# Patient Record
Sex: Male | Born: 1985 | Race: White | Hispanic: No | Marital: Married | Smoking: Current every day smoker
Health system: Southern US, Community
[De-identification: ages and names within clinical notes are randomized; demographics above are authoritative.]

## PROBLEM LIST (undated history)

## (undated) HISTORY — PX: TYMPANOSTOMY TUBE PLACEMENT: SHX32

## (undated) HISTORY — PX: TONSILLECTOMY: SUR1361

---

## 2016-10-06 ENCOUNTER — Emergency Department (HOSPITAL_COMMUNITY): Payer: Self-pay

## 2016-10-06 ENCOUNTER — Encounter (HOSPITAL_COMMUNITY): Payer: Self-pay | Admitting: *Deleted

## 2016-10-06 ENCOUNTER — Emergency Department (HOSPITAL_COMMUNITY)
Admission: EM | Admit: 2016-10-06 | Discharge: 2016-10-06 | Disposition: A | Discharge status: Home / Self Care | Payer: Self-pay | Attending: Emergency Medicine | Admitting: Emergency Medicine

## 2016-10-06 DIAGNOSIS — F17210 Nicotine dependence, cigarettes, uncomplicated: Secondary | ICD-10-CM | POA: Insufficient documentation

## 2016-10-06 DIAGNOSIS — X58XXXA Exposure to other specified factors, initial encounter: Secondary | ICD-10-CM | POA: Insufficient documentation

## 2016-10-06 DIAGNOSIS — Y939 Activity, unspecified: Secondary | ICD-10-CM | POA: Insufficient documentation

## 2016-10-06 DIAGNOSIS — Y929 Unspecified place or not applicable: Secondary | ICD-10-CM | POA: Insufficient documentation

## 2016-10-06 DIAGNOSIS — Y999 Unspecified external cause status: Secondary | ICD-10-CM | POA: Insufficient documentation

## 2016-10-06 DIAGNOSIS — M79645 Pain in left finger(s): Secondary | ICD-10-CM

## 2016-10-06 DIAGNOSIS — S63697A Other sprain of left little finger, initial encounter: Secondary | ICD-10-CM | POA: Insufficient documentation

## 2016-10-06 MED ORDER — TRAMADOL HCL 50 MG PO TABS: 50.0000 mg | ORAL_TABLET | Freq: Four times a day (QID) | ORAL | 0 refills | Status: AC | PRN

## 2016-10-06 MED ORDER — ACETAMINOPHEN 325 MG PO TABS: 650.0000 mg | ORAL_TABLET | Freq: Once | ORAL | Status: DC

## 2016-10-06 MED ORDER — IBUPROFEN 600 MG PO TABS: 600.0000 mg | ORAL_TABLET | Freq: Four times a day (QID) | ORAL | 0 refills | Status: AC

## 2016-10-06 MED ORDER — ONDANSETRON HCL 4 MG PO TABS
4.0000 mg | ORAL_TABLET | Freq: Once | ORAL | Status: AC
  Administered 2016-10-06: 4 mg via ORAL
  Filled 2016-10-06: qty 1

## 2016-10-06 MED ORDER — IBUPROFEN 800 MG PO TABS
800.0000 mg | ORAL_TABLET | Freq: Once | ORAL | Status: AC
  Administered 2016-10-06: 800 mg via ORAL
  Filled 2016-10-06: qty 1

## 2016-10-06 MED ORDER — TRAMADOL HCL 50 MG PO TABS
100.0000 mg | ORAL_TABLET | Freq: Once | ORAL | Status: AC
  Administered 2016-10-06: 100 mg via ORAL
  Filled 2016-10-06: qty 2

## 2016-10-06 NOTE — Discharge Instructions (Signed)
Please use the splint for the next 5 to 7 days. See Dr Romeo AppleHarrison for orthopedic evaluation if not improving. Apply ice. Use ibuprofen with each meal and at bedtime. Use ultram for more severe pain.This medication may cause drowsiness. Please do not drink, drive, or participate in activity that requires concentration while taking this medication.

## 2016-10-06 NOTE — ED Triage Notes (Signed)
Pt comes in with left hand pointer finger and pinky finger pain starting yesterday. Denies any injury. Pain worsens with movement.

## 2016-10-06 NOTE — ED Provider Notes (Signed)
[facility] DEPT Provider Note   CSN: [id number] Arrival date & time: 10/06/16  1446     History   Chief Complaint Chief Complaint  Patient presents with  . Hand Pain    HPI Gabriel Baldwin is a 31 y.o. male.  Tried to break a fall yesterday.   The history is provided by the patient.  Hand Pain  This is a new problem. The current episode started yesterday. The problem occurs hourly. The problem has been gradually worsening. Pertinent negatives include no chest pain, no abdominal pain and no shortness of breath. Associated symptoms comments: Difficulty moving the left little finger.. The symptoms are aggravated by bending (movement and palpation.). Nothing relieves the symptoms. Treatments tried: ibuprofen. The treatment provided no relief.    History reviewed. No pertinent past medical history.  There are no active problems to display for this patient.   Past Surgical History:  Procedure Laterality Date  . TONSILLECTOMY    . TYMPANOSTOMY TUBE PLACEMENT         Home Medications    Prior to Admission medications   Not on File    Family History No family history on file.  Social History Social History  Substance Use Topics  . Smoking status: Current Every Day Smoker    Packs/day: 1.00    Types: Cigarettes  . Smokeless tobacco: Never Used  . Alcohol use No     Allergies   Patient has no known allergies.   Review of Systems Review of Systems  Constitutional: Negative for activity change.       All ROS Neg except as noted in HPI  HENT: Negative for nosebleeds.   Eyes: Negative for photophobia and discharge.  Respiratory: Negative for cough, shortness of breath and wheezing.   Cardiovascular: Negative for chest pain and palpitations.  Gastrointestinal: Negative for abdominal pain and blood in stool.  Genitourinary: Negative for dysuria, frequency and hematuria.  Musculoskeletal: Negative for arthralgias, back pain and neck pain.       Finger pain.    Skin: Negative.   Neurological: Negative for dizziness, seizures and speech difficulty.  Psychiatric/Behavioral: Negative for confusion and hallucinations.     Physical Exam Updated Vital Signs BP (!) 142/96 (BP Location: Right Arm)   Pulse 90   Temp 97.8 F (36.6 C) (Oral)   Resp 18   Ht 5\' 9"  (1.753 m)   Wt 99.8 kg   SpO2 100%   BMI 32.49 kg/m   Physical Exam  Constitutional: He is oriented to person, place, and time. He appears well-developed and well-nourished.  Non-toxic appearance.  HENT:  Head: Normocephalic.  Right Ear: Tympanic membrane and external ear normal.  Left Ear: Tympanic membrane and external ear normal.  Eyes: EOM and lids are normal. Pupils are equal, round, and reactive to light.  Neck: Normal range of motion. Neck supple. Carotid bruit is not present.  Cardiovascular: Normal rate, regular rhythm, normal heart sounds, intact distal pulses and normal pulses.   Pulmonary/Chest: Breath sounds normal. No respiratory distress.  Abdominal: Soft. Bowel sounds are normal. There is no tenderness. There is no guarding.  Musculoskeletal:       Left hand: He exhibits decreased range of motion, tenderness and swelling. Normal sensation noted. Normal strength noted.       Hands: Lymphadenopathy:       Head (right side): No submandibular adenopathy present.       Head (left side): No submandibular adenopathy present.    He has no cervical  adenopathy.  Neurological: He is alert and oriented to person, place, and time. He has normal strength. No cranial nerve deficit or sensory deficit.  Skin: Skin is warm and dry.  Psychiatric: He has a normal mood and affect. His speech is normal.  Nursing note and vitals reviewed.    ED Treatments / Results  Labs (all labs ordered are listed, but only abnormal results are displayed) Labs Reviewed - No data to display  EKG  EKG Interpretation None       Radiology Dg Hand Complete Left  Result Date:  10/06/2016 CLINICAL DATA:  Fall.  Unable to bend pinky finger. EXAM: LEFT HAND - COMPLETE 3+ VIEW COMPARISON:  None. FINDINGS: There is no evidence of fracture or dislocation. There is no evidence of arthropathy or other focal bone abnormality. Soft tissues are unremarkable. IMPRESSION: Negative. Electronically Signed   By: Elsie StainJohn T Curnes M.D.   On: 10/06/2016 16:06    Procedures .Splint Application Date/Time: 10/06/2016 5:14 PM Performed by: Ivery QualeBRYANT, Rafi Kenneth Authorized by: Ivery QualeBRYANT, Shirlena Brinegar   Consent:    Consent obtained:  Verbal   Consent given by:  Patient   Risks discussed:  Pain Pre-procedure details:    Sensation:  Normal   Skin color:  Normal Procedure details:    Laterality:  Left   Location:  Finger   Finger:  L small finger   Splint type:  Finger   Supplies:  Aluminum splint and elastic bandage Post-procedure details:    Pain:  Unchanged   Sensation:  Normal   Skin color:  Normal   Patient tolerance of procedure:  Tolerated well, no immediate complications   (including critical care time)  Medications Ordered in ED Medications - No data to display   Initial Impression / Assessment and Plan / ED Course  I have reviewed the triage vital signs and the nursing notes.  Pertinent labs & imaging results that were available during my care of the patient were reviewed by me and considered in my medical decision making (see chart for details).       Final Clinical Impressions(s) / ED Diagnoses MDM The exam Favors a sprain of the left little finger. Patient was placed in a splint to assist with pain and discomfort. Patient is to follow-up with orthopedics if not improving. Patient acknowledges understanding of the discharge instructions.    Final diagnoses:  Finger pain, left  Other sprain of left little finger, initial encounter    New Prescriptions Discharge Medication List as of 10/06/2016  5:11 PM    START taking these medications   Details  ibuprofen (ADVIL,MOTRIN)  600 MG tablet Take 1 tablet (600 mg total) by mouth 4 (four) times daily., Starting Wed 10/06/2016, Print    traMADol (ULTRAM) 50 MG tablet Take 1 tablet (50 mg total) by mouth every 6 (six) hours as needed., Starting Wed 10/06/2016, Print         Ivery QualeBryant, Tomeika Weinmann, PA-C 10/08/16 1705    Samuel JesterMcManus, Kathleen, DO 10/10/16 1542

## 2017-11-03 IMAGING — DX DG HAND COMPLETE 3+V*L*
3 series · 3 of 3 positions shown · non-contrast
Comparison: None.

CLINICAL DATA: Fall.  Unable to bend pinky finger.

EXAM:
LEFT HAND - COMPLETE 3+ VIEW

[hand pa]
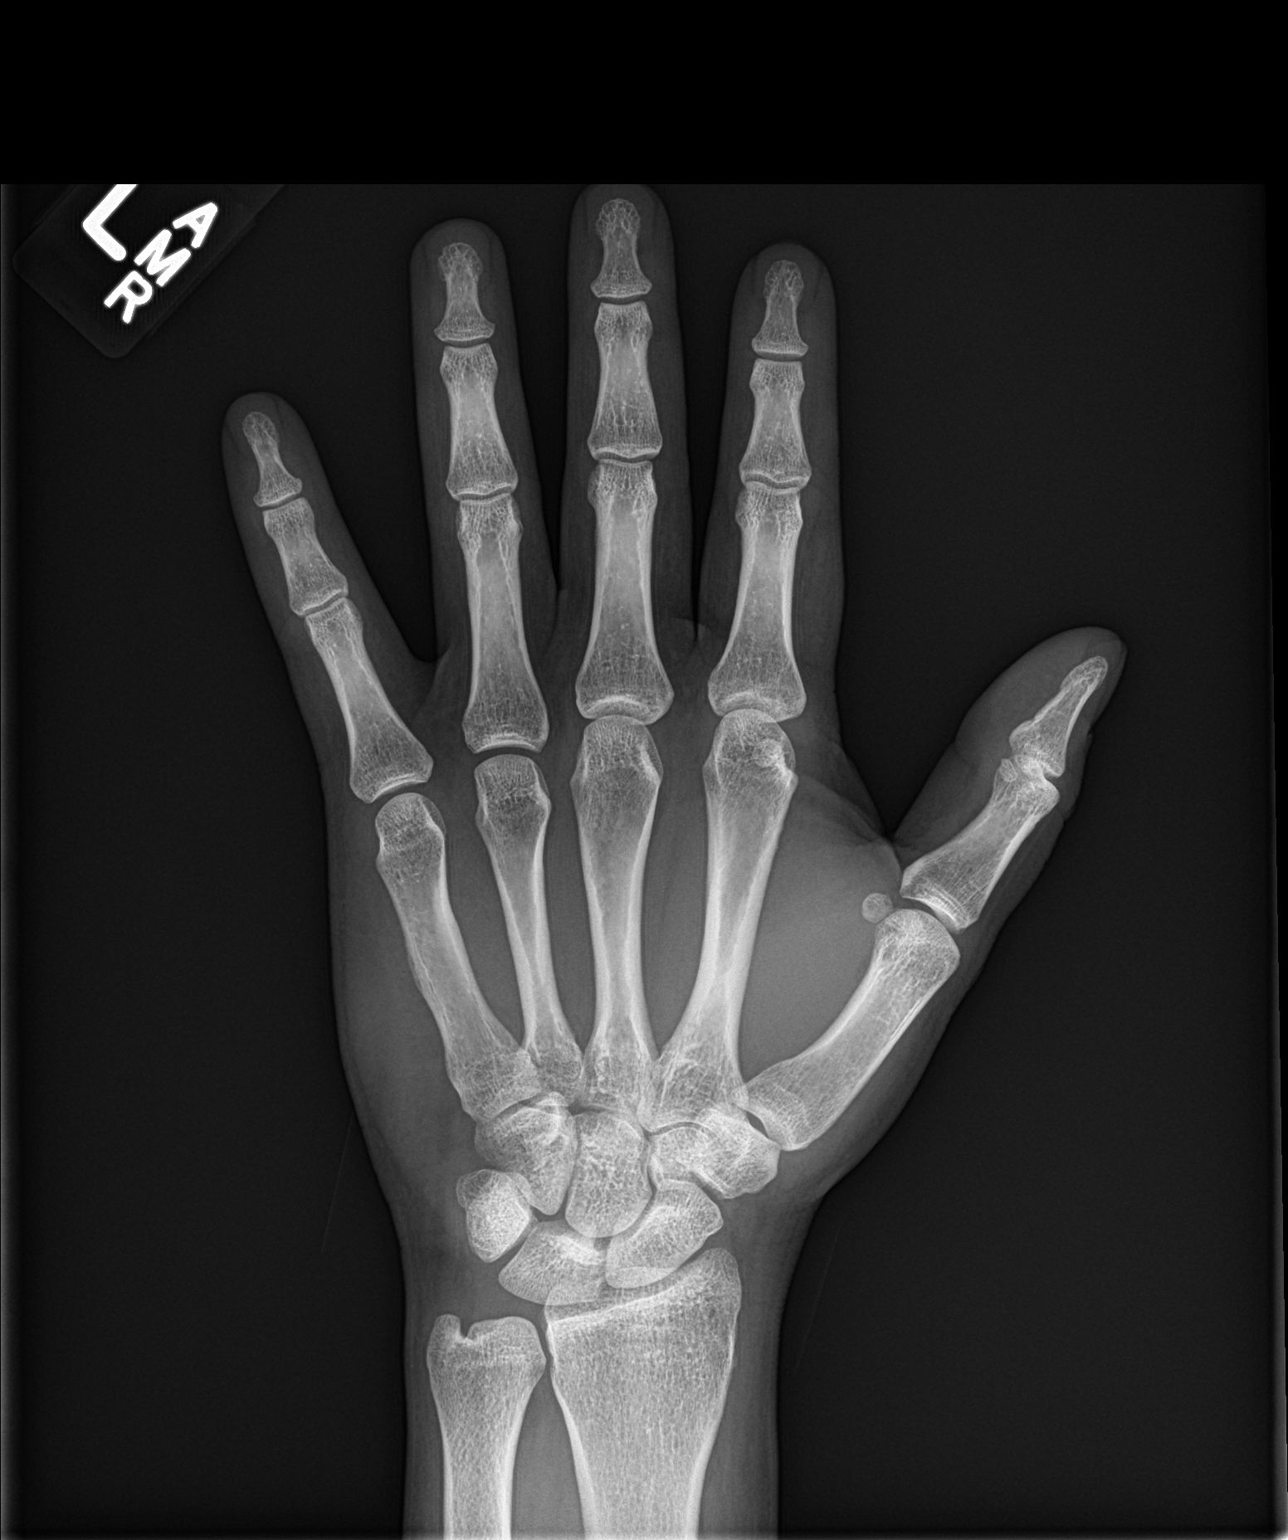

[hand obl]
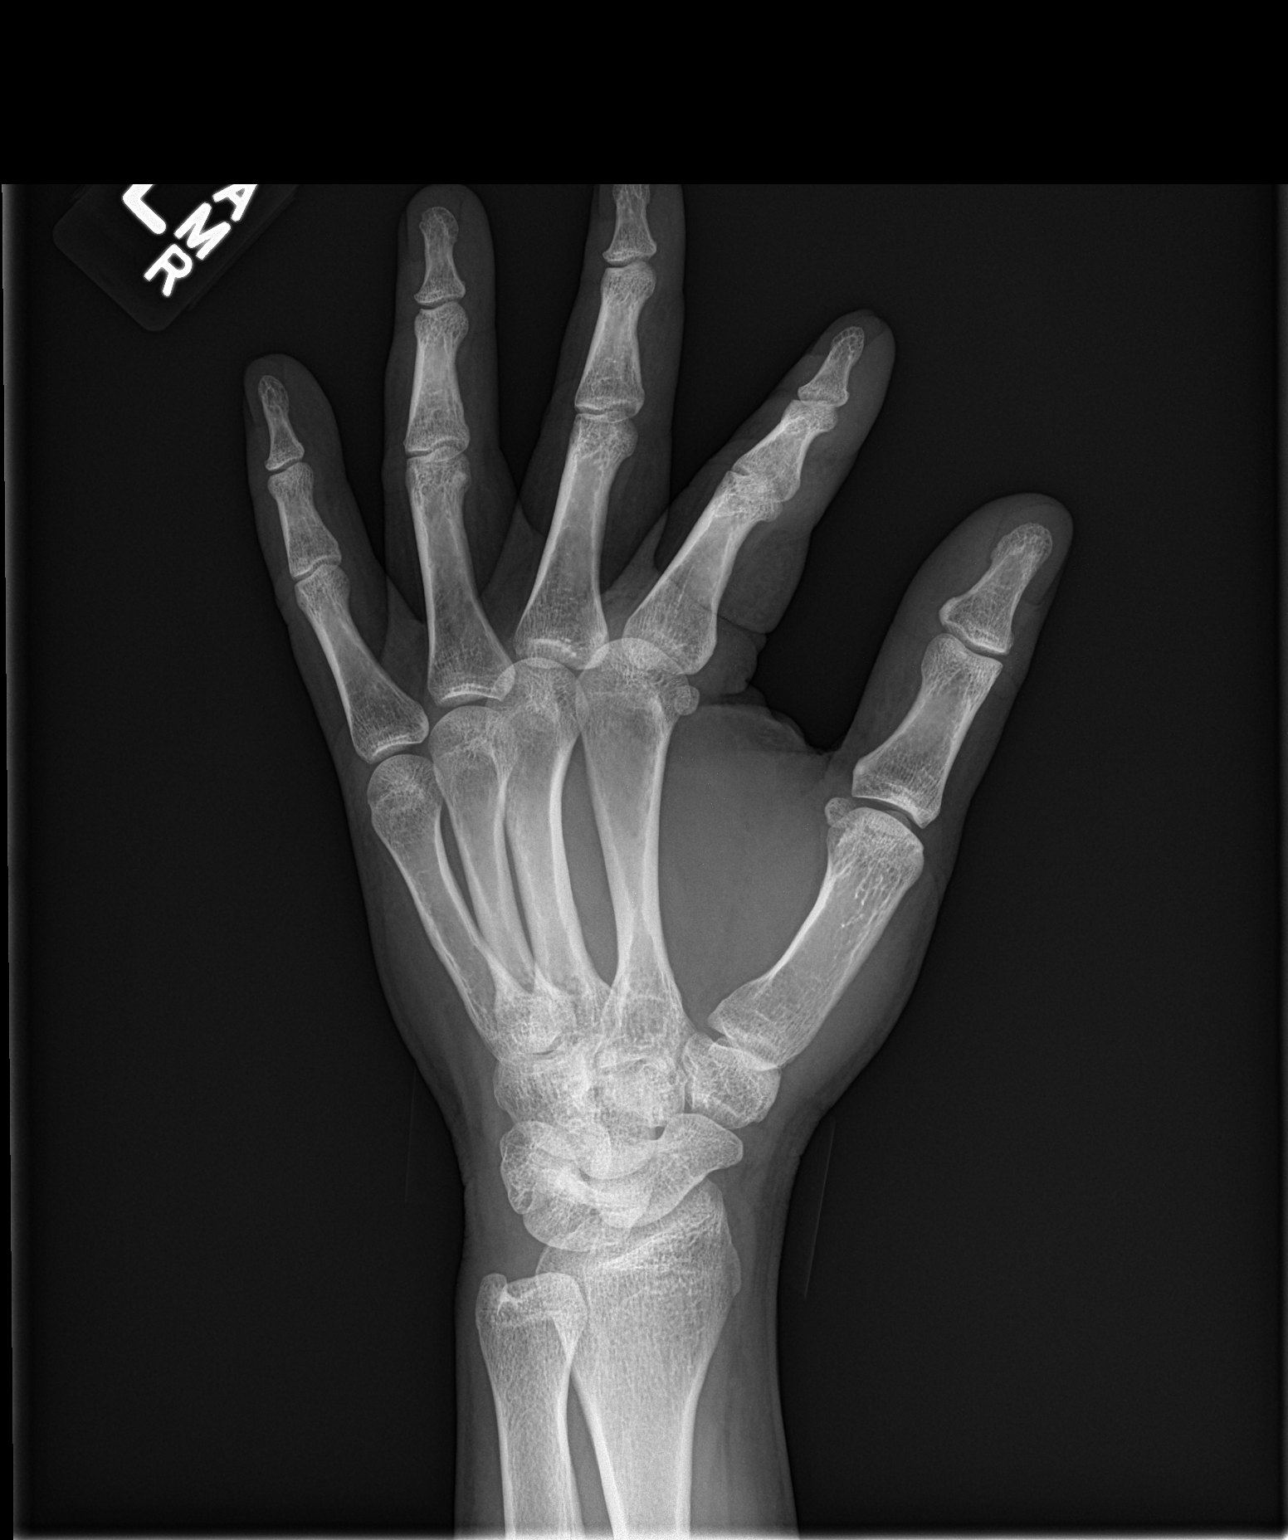

[hand lat]
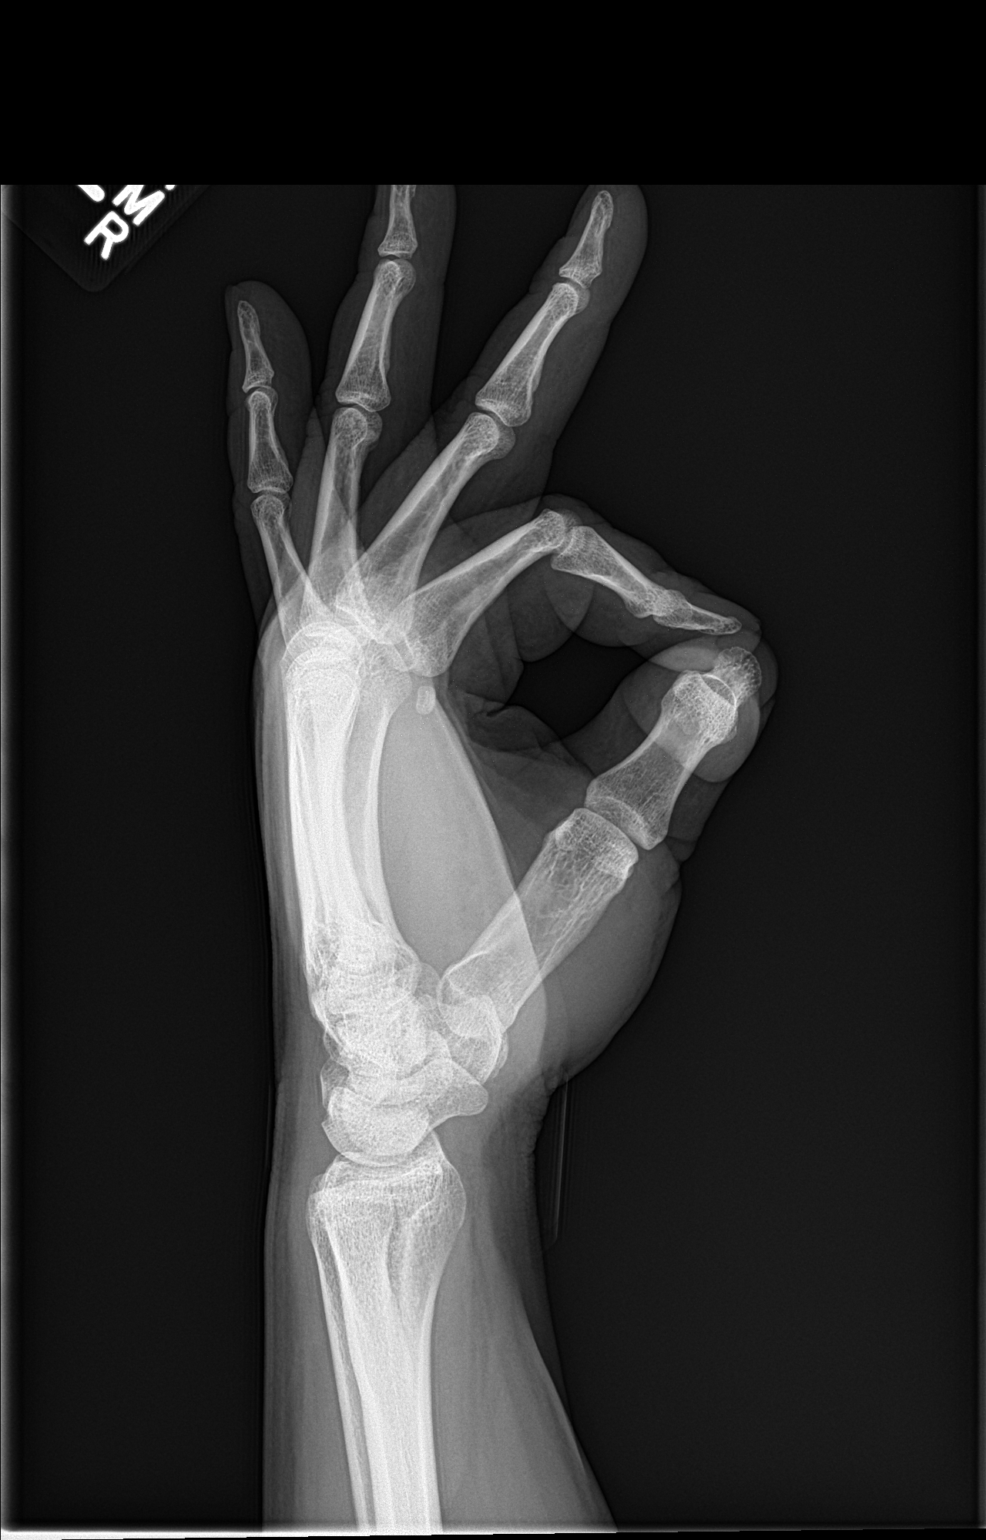

[3 of 3 positions shown; findings below may reference images not displayed]

FINDINGS: There is no evidence of fracture or dislocation. There is no
evidence of arthropathy or other focal bone abnormality. Soft
tissues are unremarkable.
IMPRESSION: Negative.

## 2022-03-31 DEATH — deceased
